# Patient Record
Sex: Male | Born: 2000 | Race: White | Hispanic: No | Marital: Single | Smoking: Never smoker
Health system: Southern US, Community
[De-identification: ages and names within clinical notes are randomized; demographics above are authoritative.]

## PROBLEM LIST (undated history)

## (undated) DIAGNOSIS — Z789 Other specified health status: Secondary | ICD-10-CM

## (undated) HISTORY — PX: CYST REMOVAL NECK: SHX6281

---
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
## 2023-05-01 NOTE — Discharge Instructions (Signed)
T & A INSTRUCTION SHEET - [facility]  PAUL JUENGEL, MD    INFORMATION SHEET FOR A TONSILLECTOMY AND ADENDOIDECTOMY  About Your Tonsils and Adenoids  The tonsils and adenoids are normal body tissues that are part of our immune system.  They normally help to protect us against diseases that may enter our mouth and nose. However, sometimes the tonsils and/or adenoids become too large and obstruct our breathing, especially at night.    If either of these things happen it helps to remove the tonsils and adenoids in order to become healthier. The operation to remove the tonsils and adenoids is called a tonsillectomy and adenoidectomy.  The Location of Your Tonsils and Adenoids  The tonsils are located in the back of the throat on both side and sit in a cradle of muscles. The adenoids are located in the roof of the mouth, behind the nose, and closely associated with the opening of the Eustachian tube to the ear.  Surgery on Tonsils and Adenoids  A tonsillectomy and adenoidectomy is a short operation which takes about thirty minutes.  This includes being put to sleep and being awakened. Tonsillectomies and adenoidectomies are performed at [facility] and may require observation period in the recovery room prior to going home. Children are required to remain in recovery for at least 45 minutes.   Following the Operation for a Tonsillectomy  A cautery machine is used to control bleeding. Bleeding from a tonsillectomy and adenoidectomy is minimal and postoperatively the risk of bleeding is approximately four percent, although this rarely life threatening.  After your tonsillectomy and adenoidectomy post-op care at home: 1. Our patients are able to go home the same day. You may be given prescriptions for pain medications, if indicated. 2. It is extremely important to remember that fluid intake is of utmost importance after a tonsillectomy. The  amount that you drink must be maintained in the postoperative period. A good indication of whether a child is getting enough fluid is whether his/her urine output is constant. As long as children are urinating or wetting their diaper every 6 - 8 hours this is usually enough fluid intake.   3. Although rare, this is a risk of some bleeding in the first ten days after surgery. This usually occurs between day five and nine postoperatively. This risk of bleeding is approximately four percent. If you or your child should have any bleeding you should remain calm and notify our office or go directly to the emergency room at [facility] where they will contact us. Our doctors are available seven days a week for notification. We recommend sitting up quietly in a chair, place an ice pack on the front of the neck and spitting out the blood gently until we are able to contact you. Adults should gargle gently with ice water and this may help stop the bleeding. If the bleeding does not stop after a short time, i.e. 10 to 15 minutes, or seems to be increasing again, please contact us or go to the hospital.   4. It is common for the pain to be worse at 5 - 7 days postoperatively. This occurs because the "scab" is peeling off and the mucous membrane (skin of the throat) is growing back where the tonsils were.   5. It is common for a low-grade fever, less than 102, during the first week after a tonsillectomy and adenoidectomy. It is usually due to   not drinking enough liquids, and we suggest your use liquid Tylenol (acetaminophen) or the pain medicine with Tylenol (acetaminophen) prescribed in order to keep your temperature below 102. Please follow the directions on the back of the bottle. 6. Recommendations for post-operative pain in children and adults: a) For Children 12 and younger: Recommendations are for oral Tylenol (acetaminophen) and oral Motrin (ibuprofen). Administer the Tylenol (acetaminophen) and  Motrin as stated on bottle for patient's age/weight. Sometimes it may be necessary to alternate the Tylenol (acetaminophen) and Motrin for improved pain control. Motrin (ibuprofen) does last slightly longer so many patients benefit from being given this prior to bedtime. All children should avoid Aspirin products for 2 weeks following surgery. b) For children over the age of 12: Tylenol (acetaminophen) is the preferred first choice for pain control. Depending on your child's size, sometimes they will be given a combination of Tylenol (acetaminophen) and hydrocodone medication or sometimes it will be recommended they take Motrin (ibuprofen) in addition to the Tylenol (acetaminophen). Narcotics should always be used with caution in children following surgery as they can suppress their breathing and switching to over the counter Tylenol (acetaminophen) and Motrin (ibuprofen) as soon as possible is recommended. All patients should avoid Aspirin products for 2 weeks following surgery. c) Adults: Usually adults will require a narcotic pain medication following a tonsillectomy. This usually has either hydrocodone or oxycodone in it and can usually be taken every 4 to 6 hours as needed for moderate pain. If the medication does not have Tylenol (acetaminophen) in it, you may also supplement Tylenol (acetaminophen) as needed every 4 to 6 hours for breakthrough or mild pain. Adults should avoid Aspirin, Aleve, Motrin, and Ibuprofen products for 2 weeks following surgery as they can increase your risk of bleeding. 7. If you happen to look in the mirror or into your child's mouth you will see white/gray patches on the back of the throat. This is what a scab looks like in the mouth and is normal after having a tonsillectomy and adenoidectomy. They will disappear once the tonsil areas heal completely. However, it may cause a noticeable odor, and this too will disappear with time.     8. You or your child may experience ear  pain after having a tonsillectomy and adenoidectomy.  This is called referred pain and comes from the throat, but it is felt in the ears.  Ear pain is quite common and expected. It will usually go away after ten days. There is usually nothing wrong with the ears, and it is primarily due to the healing area stimulating the nerve to the ear that runs along the side of the throat. Use either the prescribed pain medicine or Tylenol (acetaminophen) as needed.  9. The throat tissues after a tonsillectomy are obviously sensitive. Smoking around children who have had a tonsillectomy significantly increases the risk of bleeding. DO NOT SMOKE!  What to Expect Each Day  First Day at Home 1. Patients will be discharged home the same day.  2. Drink at least four glasses of liquid a day. Clear, cool liquids are recommended. Fruit juices containing citric acid are not recommended because they tend to cause pain. Carbonated beverages are allowed if you pour them from glass to glass to remove the bubbles as these tend to cause discomfort. Avoid alcoholic beverages.  3. Eat very soft foods such as soups, broth, jello, custard, pudding, ice cream, popsicles, applesauce, mashed potatoes, and in general anything that you can crush between your   tongue and the roof of your mouth. Try adding Carnation Instant Breakfast Mix into your food for extra calories. It is not uncommon to lose 5 to 10 pounds of fluid weight. The weight will be gained back quickly once you're feeling better and drinking more.  4. Sleep with your head elevated on two pillows for about three days to help decrease the swelling.  5. DO NOT SMOKE!  Day Two  1. Rest as much as possible. Use common sense in your activities.  2. Continue drinking at least four glasses of liquid per day.  3. Follow the soft diet.  4. Use your pain medication as needed.  Day Three  1. Advance your activity as you are able and continue to follow the previous day's suggestions.   Days Four Through Six  1. Advance your diet and begin to eat more solid foods such as chopped hamburger. 2. Advance your activities slowly. Children should be kept mostly around the house.  3. Not uncommonly, there will be more pain at this time. It is temporary, usually lasting a day or two.  Day Seven Through Ten  1. Most individuals by this time are able to return to work or school unless otherwise instructed. Consider sending children back to school for a half day on the first day back. 

## 2023-05-06 ENCOUNTER — Encounter: Payer: Self-pay | Admitting: Otolaryngology

## 2023-05-09 ENCOUNTER — Ambulatory Visit
Admission: RE | Admit: 2023-05-09 | Discharge: 2023-05-09 | Disposition: A | Discharge status: Home / Self Care | Payer: No Typology Code available for payment source | Attending: Otolaryngology | Admitting: Otolaryngology

## 2023-05-09 ENCOUNTER — Encounter
Admission: RE | Disposition: A | Discharge status: Home / Self Care | Payer: Self-pay | Source: Home / Self Care | Attending: Otolaryngology

## 2023-05-09 ENCOUNTER — Other Ambulatory Visit: Payer: Self-pay

## 2023-05-09 ENCOUNTER — Ambulatory Visit: Payer: No Typology Code available for payment source | Admitting: Anesthesiology

## 2023-05-09 ENCOUNTER — Encounter: Payer: Self-pay | Admitting: Otolaryngology

## 2023-05-09 DIAGNOSIS — J3501 Chronic tonsillitis: Secondary | ICD-10-CM | POA: Insufficient documentation

## 2023-05-09 DIAGNOSIS — J358 Other chronic diseases of tonsils and adenoids: Secondary | ICD-10-CM | POA: Insufficient documentation

## 2023-05-09 HISTORY — PX: TONSILLECTOMY: SHX5217

## 2023-05-09 HISTORY — DX: Other specified health status: Z78.9

## 2023-05-09 SURGERY — TONSILLECTOMY
Anesthesia: General | Laterality: Bilateral

## 2023-05-09 MED ORDER — LACTATED RINGERS IV SOLN: INTRAVENOUS | Status: DC

## 2023-05-09 MED ORDER — OXYCODONE HCL 5 MG/5ML PO SOLN
5.0000 mg | ORAL | Status: AC
  Administered 2023-05-09: 5 mg via ORAL

## 2023-05-09 MED ORDER — DEXAMETHASONE SODIUM PHOSPHATE 4 MG/ML IJ SOLN
INTRAMUSCULAR | Status: DC | PRN
  Administered 2023-05-09: 12 mg via INTRAVENOUS

## 2023-05-09 MED ORDER — PROPOFOL 10 MG/ML IV BOLUS
INTRAVENOUS | Status: DC | PRN
  Administered 2023-05-09: 200 mg via INTRAVENOUS

## 2023-05-09 MED ORDER — ACETAMINOPHEN 10 MG/ML IV SOLN
1000.0000 mg | Freq: Once | INTRAVENOUS | Status: AC
  Administered 2023-05-09: 1000 mg via INTRAVENOUS

## 2023-05-09 MED ORDER — MIDAZOLAM HCL 5 MG/5ML IJ SOLN
INTRAMUSCULAR | Status: DC | PRN
  Administered 2023-05-09: 2 mg via INTRAVENOUS

## 2023-05-09 MED ORDER — ONDANSETRON HCL 4 MG/2ML IJ SOLN
INTRAMUSCULAR | Status: DC | PRN
  Administered 2023-05-09: 4 mg via INTRAVENOUS

## 2023-05-09 MED ORDER — HYDROCODONE-ACETAMINOPHEN 7.5-325 MG/15ML PO SOLN: 15.0000 mL | Freq: Four times a day (QID) | ORAL | 0 refills | Status: AC | PRN

## 2023-05-09 MED ORDER — SUCCINYLCHOLINE CHLORIDE 200 MG/10ML IV SOSY
PREFILLED_SYRINGE | INTRAVENOUS | Status: DC | PRN
  Administered 2023-05-09: 100 mg via INTRAVENOUS

## 2023-05-09 MED ORDER — FENTANYL CITRATE (PF) 100 MCG/2ML IJ SOLN
INTRAMUSCULAR | Status: DC | PRN
  Administered 2023-05-09: 100 ug via INTRAVENOUS

## 2023-05-09 MED ORDER — DEXMEDETOMIDINE HCL IN NACL 80 MCG/20ML IV SOLN
INTRAVENOUS | Status: DC | PRN
  Administered 2023-05-09: 8 ug via INTRAVENOUS

## 2023-05-09 MED ORDER — LIDOCAINE HCL (CARDIAC) PF 100 MG/5ML IV SOSY
PREFILLED_SYRINGE | INTRAVENOUS | Status: DC | PRN
  Administered 2023-05-09: 100 mg via INTRAVENOUS

## 2023-05-09 SURGICAL SUPPLY — 13 items
ANTIFOG SOL W/FOAM PAD STRL (MISCELLANEOUS) ×1
BLADE ELECT COATED/INSUL 125 (ELECTRODE) ×1 IMPLANT
CANISTER SUCT 1200ML W/VALVE (MISCELLANEOUS) ×1 IMPLANT
ELECT REM PT RETURN 9FT ADLT (ELECTROSURGICAL) ×1
ELECTRODE REM PT RTRN 9FT ADLT (ELECTROSURGICAL) ×1 IMPLANT
GLOVE SURG GAMMEX PI TX LF 7.5 (GLOVE) ×1 IMPLANT
KIT TURNOVER KIT A (KITS) ×1 IMPLANT
PACK TONSIL AND ADENOID CUSTOM (PACKS) ×1 IMPLANT
PENCIL SMOKE EVACUATOR (MISCELLANEOUS) ×1 IMPLANT
SLEEVE SUCTION 125 (MISCELLANEOUS) ×1 IMPLANT
SOLUTION ANTFG W/FOAM PAD STRL (MISCELLANEOUS) ×1 IMPLANT
SPONGE TONSIL 1 RF SGL (DISPOSABLE) ×1 IMPLANT
STRAP BODY AND KNEE 60X3 (MISCELLANEOUS) ×1 IMPLANT

## 2023-05-09 NOTE — Anesthesia Preprocedure Evaluation (Signed)
Anesthesia Evaluation  Patient identified by MRN, date of birth, ID band Patient awake    Reviewed: Allergy & Precautions, H&P , NPO status , Patient's Chart, lab work & pertinent test results  Airway Mallampati: I  TM Distance: >3 FB Neck ROM: Full    Dental no notable dental hx.    Pulmonary neg pulmonary ROS   Pulmonary exam normal breath sounds clear to auscultation       Cardiovascular negative cardio ROS Normal cardiovascular exam Rhythm:Regular Rate:Normal     Neuro/Psych negative neurological ROS  negative psych ROS   GI/Hepatic negative GI ROS, Neg liver ROS,,,  Endo/Other  negative endocrine ROS    Renal/GU negative Renal ROS  negative genitourinary   Musculoskeletal negative musculoskeletal ROS (+)    Abdominal   Peds negative pediatric ROS (+)  Hematology negative hematology ROS (+)   Anesthesia Other Findings   Reproductive/Obstetrics negative OB ROS                             Anesthesia Physical Anesthesia Plan  ASA: 1  Anesthesia Plan: General ETT   Post-op Pain Management:    Induction: Intravenous  PONV Risk Score and Plan:   Airway Management Planned: Oral ETT  Additional Equipment:   Intra-op Plan:   Post-operative Plan: Extubation in OR  Informed Consent: I have reviewed the patients History and Physical, chart, labs and discussed the procedure including the risks, benefits and alternatives for the proposed anesthesia with the patient or authorized representative who has indicated his/her understanding and acceptance.     Dental Advisory Given  Plan Discussed with: Anesthesiologist, CRNA and Surgeon  Anesthesia Plan Comments: (Patient consented for risks of anesthesia including but not limited to:  - adverse reactions to medications - damage to eyes, teeth, lips or other oral mucosa - nerve damage due to positioning  - sore throat or  hoarseness - Damage to heart, brain, nerves, lungs, other parts of body or loss of life  Patient voiced understanding.)       Anesthesia Quick Evaluation  

## 2023-05-09 NOTE — Transfer of Care (Signed)
Immediate Anesthesia Transfer of Care Note  Patient: Eugene Thornton  Procedure(s) Performed: TONSILLECTOMY (Bilateral)  Patient Location: PACU  Anesthesia Type: General ETT  Level of Consciousness: awake, alert  and patient cooperative  Airway and Oxygen Therapy: Patient Spontanous Breathing and Patient connected to supplemental oxygen  Post-op Assessment: Post-op Vital signs reviewed, Patient's Cardiovascular Status Stable, Respiratory Function Stable, Patent Airway and No signs of Nausea or vomiting  Post-op Vital Signs: Reviewed and stable  Complications: No notable events documented.

## 2023-05-09 NOTE — Anesthesia Procedure Notes (Signed)
Procedure Name: Intubation Date/Time: 05/09/2023 9:15 AM  Performed by: Adreena Willits, [place], CRNAPre-anesthesia Checklist: Patient identified, Patient being monitored, Timeout performed, Emergency Drugs available and Suction available Patient Re-evaluated:Patient Re-evaluated prior to induction Oxygen Delivery Method: Circle system utilized Preoxygenation: Pre-oxygenation with 100% oxygen Induction Type: IV induction Ventilation: Mask ventilation without difficulty Laryngoscope Size: Mac and 4 Grade View: Grade II Tube type: Oral Rae Tube size: 7.5 mm Number of attempts: 1 Airway Equipment and Method: Stylet Placement Confirmation: ETT inserted through vocal cords under direct vision, positive ETCO2 and breath sounds checked- equal and bilateral Secured at: 23 cm Tube secured with: Tape Dental Injury: Teeth and Oropharynx as per pre-operative assessment

## 2023-05-09 NOTE — H&P (Signed)
H&P has been reviewed and patient reevaluated, no changes necessary. To be downloaded later.  

## 2023-05-09 NOTE — Anesthesia Postprocedure Evaluation (Signed)
Anesthesia Post Note  Patient: Eugene Thornton  Procedure(s) Performed: TONSILLECTOMY (Bilateral)  Patient location during evaluation: PACU Anesthesia Type: General Level of consciousness: awake and alert Pain management: pain level controlled Vital Signs Assessment: post-procedure vital signs reviewed and stable Respiratory status: spontaneous breathing, nonlabored ventilation, respiratory function stable and patient connected to nasal cannula oxygen Cardiovascular status: blood pressure returned to baseline and stable Postop Assessment: no apparent nausea or vomiting Anesthetic complications: no   No notable events documented.   Last Vitals:  Vitals:   05/09/23 1004 05/09/23 1015  BP:  (!) 111/51  Pulse: 79 75  Resp: 19 17  Temp:  (!) 36.2 C  SpO2: 100% 100%    Last Pain:  Vitals:   05/09/23 1015  TempSrc:   PainSc: 3                  Ezekiah Massie C Shantelle Alles

## 2023-05-09 NOTE — Op Note (Signed)
05/09/2023  9:34 AM    Eugene Thornton  [id number]   Pre-Op Dx: Chronic tonsillitis and tonsillar hypertrophy  Post-op Dx: Same  Proc: Tonsillectomy  Surg:  [name]  Anes:  GOT  EBL: 10 mL  Comp: None  Findings: His right tonsil was very cryptic with lots of debris hidden in the tonsil.  Both tonsils were enlarged but his left 1 was more buried under the anterior tonsillar pillar  Procedure: The patient was brought to the operating room and placed in the supine position.  He was given general anesthesia by oral endotracheal intubation.  Once the patient was fully asleep a Dingman mouth blade was used to visualize the oropharynx.  His tonsils were enlarged on both sides but the left side was buried on the tonsillar pillar while the right side was sticking out into the airway making it look larger.  The soft palate was retracted and the adenoids were shrunk down and almost flat on the posterior nasopharyngeal wall.  These were not addressed.  The left tonsil was grasped and pulled medially.  The anterior pillar was incised.  The tonsil was dissected from its fossa using sharp and blunt dissection along with electrocautery.  Bleeding was controlled with direct pressure and electrocautery.  The right tonsil was then grasped and pulled medially and as it was pulled out there was lots of debris evident in the tonsil.  The anterior tonsillar pillar was incised and it was dissected from its fossa using sharp and blunt dissection and electrocautery.  Bleeding was controlled with direct pressure and electrocautery.  There is almost no bleeding as it was cauterized anytime a blood vessel was found to the tonsil.  Total estimated blood loss was 10 mL.  Both tonsillar fossa's were open and clear.  There is no unusual bleeding.  A NG tube was placed into his stomach was suctioned this out and there is minimal clear fluid that was removed.  The patient tolerated the procedure well.  He was awakened  and taken to the recovery room in satisfactory condition.  There were no operative complications.  Dispo:   To PACU to be discharged home  Plan: To follow-up in the office in a couple of weeks to make sure his throat is healing well.  He will take it easy for 10 to 14 days at home before starting strenuous activity.  He will use liquid Tylenol or Tylenol with hydrocodone for pain.  He will push liquids and increase to soft foods as tolerated  [name]  05/09/2023 9:34 AM

## 2023-05-10 ENCOUNTER — Encounter: Payer: Self-pay | Admitting: Otolaryngology
